# Patient Record
Sex: Female | Born: 1940 | Race: White | Hispanic: No | Marital: Married
Health system: Midwestern US, Academic
[De-identification: ages and names within clinical notes are randomized; demographics above are authoritative.]

---

## 2019-07-12 ENCOUNTER — Encounter: Admit: 2019-07-12 | Discharge: 2019-07-12

## 2019-07-13 ENCOUNTER — Encounter: Admit: 2019-07-13 | Discharge: 2019-07-13

## 2019-07-13 ENCOUNTER — Ambulatory Visit: Admit: 2019-07-13 | Discharge: 2019-07-14

## 2019-07-13 DIAGNOSIS — Z9189 Other specified personal risk factors, not elsewhere classified: Secondary | ICD-10-CM

## 2019-07-13 DIAGNOSIS — E7800 Pure hypercholesterolemia, unspecified: Secondary | ICD-10-CM

## 2019-07-13 DIAGNOSIS — K219 Gastro-esophageal reflux disease without esophagitis: Secondary | ICD-10-CM

## 2019-07-13 DIAGNOSIS — G4733 Obstructive sleep apnea (adult) (pediatric): Secondary | ICD-10-CM

## 2019-07-13 DIAGNOSIS — G459 Transient cerebral ischemic attack, unspecified: Secondary | ICD-10-CM

## 2019-07-13 DIAGNOSIS — E118 Type 2 diabetes mellitus with unspecified complications: Secondary | ICD-10-CM

## 2019-07-13 DIAGNOSIS — Z01810 Encounter for preprocedural cardiovascular examination: Secondary | ICD-10-CM

## 2019-07-13 DIAGNOSIS — I10 Essential (primary) hypertension: Secondary | ICD-10-CM

## 2019-07-13 DIAGNOSIS — M797 Fibromyalgia: Secondary | ICD-10-CM

## 2019-07-13 DIAGNOSIS — R7309 Other abnormal glucose: Secondary | ICD-10-CM

## 2019-07-13 MED ORDER — METOPROLOL TARTRATE 25 MG PO TAB
25 mg | ORAL_TABLET | Freq: Two times a day (BID) | ORAL | 3 refills | 90.00000 days | Status: DC
Start: 2019-07-13 — End: 2020-06-28

## 2019-07-13 MED ORDER — METOPROLOL TARTRATE 25 MG PO TAB
25 mg | ORAL_TABLET | Freq: Two times a day (BID) | ORAL | 3 refills | 90.00000 days | Status: DC
Start: 2019-07-13 — End: 2019-07-13

## 2019-07-13 MED ORDER — ROSUVASTATIN 20 MG PO TAB
20 mg | ORAL_TABLET | Freq: Every day | ORAL | 3 refills | 90.00000 days | Status: AC
Start: 2019-07-13 — End: ?

## 2019-07-13 MED ORDER — ROSUVASTATIN 20 MG PO TAB
20 mg | ORAL_TABLET | Freq: Every day | ORAL | 3 refills | 90.00000 days | Status: DC
Start: 2019-07-13 — End: 2019-07-13

## 2019-07-13 NOTE — Telephone Encounter
07/13/19 records received from Dr. Jackie Plum, Records are available in pt chart through [facility]

## 2019-07-14 LAB — LIPID PROFILE
Lab: 184
Lab: 305 — ABNORMAL HIGH (ref <150)
Lab: 40
Lab: 5
Lab: 61 — ABNORMAL HIGH (ref 5–40)
Lab: 83

## 2019-07-18 ENCOUNTER — Encounter: Admit: 2019-07-18 | Discharge: 2019-07-18

## 2019-07-18 ENCOUNTER — Ambulatory Visit: Admit: 2019-07-18 | Discharge: 2019-07-19

## 2019-07-18 ENCOUNTER — Ambulatory Visit: Admit: 2019-07-18 | Discharge: 2019-07-18

## 2019-07-18 DIAGNOSIS — Z01818 Encounter for other preprocedural examination: Secondary | ICD-10-CM

## 2019-07-18 DIAGNOSIS — M797 Fibromyalgia: Secondary | ICD-10-CM

## 2019-07-18 DIAGNOSIS — I10 Essential (primary) hypertension: Secondary | ICD-10-CM

## 2019-07-20 ENCOUNTER — Encounter: Admit: 2019-07-20 | Discharge: 2019-07-20

## 2019-07-20 NOTE — Telephone Encounter
Pt cleared for surgery.  Addendum added to OV and faxed to [facility] at Dr. Maple Hudson office. No further needs at this.

## 2019-07-20 NOTE — Telephone Encounter
Rita from Dr. Maple Hudson office called asking for cardiac clearance letter for knee surgery on 8/4.  Message sent to [facility] to review echo and stress test. Will plan to fax clearance letter when complete.     Velva Harman - Dr. Maple Hudson office  Fax [phone]  Phone [phone]

## 2019-07-25 ENCOUNTER — Encounter: Admit: 2019-07-25 | Discharge: 2019-07-25

## 2019-07-25 DIAGNOSIS — M797 Fibromyalgia: Secondary | ICD-10-CM

## 2019-07-25 DIAGNOSIS — I10 Essential (primary) hypertension: Secondary | ICD-10-CM

## 2019-07-25 DIAGNOSIS — Z01810 Encounter for preprocedural cardiovascular examination: Secondary | ICD-10-CM

## 2020-06-28 ENCOUNTER — Encounter: Admit: 2020-06-28 | Discharge: 2020-06-28 | Payer: MEDICARE

## 2020-06-28 MED ORDER — METOPROLOL TARTRATE 25 MG PO TAB
ORAL_TABLET | Freq: Two times a day (BID) | ORAL | 0 refills | 90.00000 days | Status: AC
Start: 2020-06-28 — End: ?

## 2022-05-20 ENCOUNTER — Encounter: Admit: 2022-05-20 | Discharge: 2022-05-20 | Payer: MEDICARE

## 2022-05-21 ENCOUNTER — Encounter: Admit: 2022-05-21 | Discharge: 2022-05-21 | Payer: MEDICARE

## 2022-05-21 ENCOUNTER — Ambulatory Visit: Admit: 2022-05-21 | Discharge: 2022-05-22 | Payer: MEDICARE

## 2022-05-21 DIAGNOSIS — E7800 Pure hypercholesterolemia, unspecified: Secondary | ICD-10-CM

## 2022-05-21 DIAGNOSIS — E039 Hypothyroidism, unspecified: Secondary | ICD-10-CM

## 2022-05-21 DIAGNOSIS — E119 Type 2 diabetes mellitus without complications: Secondary | ICD-10-CM

## 2022-05-21 DIAGNOSIS — Z136 Encounter for screening for cardiovascular disorders: Secondary | ICD-10-CM

## 2022-05-21 DIAGNOSIS — M545 Chronic low back pain, unspecified back pain laterality, unspecified whether sciatica present: Secondary | ICD-10-CM

## 2022-05-21 DIAGNOSIS — D6489 Other specified anemias: Secondary | ICD-10-CM

## 2022-05-21 DIAGNOSIS — Z9189 Other specified personal risk factors, not elsewhere classified: Secondary | ICD-10-CM

## 2022-05-21 DIAGNOSIS — M797 Fibromyalgia: Secondary | ICD-10-CM

## 2022-05-21 DIAGNOSIS — R55 Syncope and collapse: Secondary | ICD-10-CM

## 2022-05-21 DIAGNOSIS — I10 Essential (primary) hypertension: Secondary | ICD-10-CM

## 2022-05-21 DIAGNOSIS — G4733 Obstructive sleep apnea (adult) (pediatric): Secondary | ICD-10-CM

## 2022-05-21 DIAGNOSIS — G459 Transient cerebral ischemic attack, unspecified: Secondary | ICD-10-CM

## 2022-05-21 NOTE — Progress Notes
Date of Service: 05/21/2022    Norma Richardson is a 81 y.o. white  female.       HPI      Norma Richardson is a 81 y.o. 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    Patient has recorded orthostatic blood pressure at home.  She has first noticed dizziness and lightheadedness when changing position from sitting down to standing up, she recently recorded a blood pressure of 136/71 mmHg while sitting down, 79/45 mmHg when standing up.  She did not have any syncopal episodes but she did have falls due to overall gait instability.  Patient has also been experiencing dizziness and lightheadedness.    In 2020 she was evaluated with an echocardiogram and a stress test this was part of the preoperative evaluation preceding left knee surgery.  She was not found to have ischemia, normal LVEF and no evidence of structural heart disease.    In addition, today patient appeared to be very pale.  We did order laboratory work, it was remarkable for a hemoglobin of 10.7 g/dL, hematocrit 16.1%, WBC 7.69, platelets were 328,000, the chemistry profile was remarkable for an elevated creatinine at 1.55 mg/dL and an elevated calcium at 10.5 mg/dL (normal range between 0.9-60.4 mg/dL).    Review of previous laboratory work in July 2020 demonstrated normal hemoglobin and hematocrit, normal creatinine, at that time calcium was mildly elevated at 10.3 mg/dL.    Patient did not report chest pain or heart palpitations.         Vitals:    05/21/22 1440   BP: (!) 146/78   BP Source: Arm, Left Upper   Pulse: 92   SpO2: 98%   O2 Device: None (Room air)   PainSc: Zero   Weight: 69.7 kg (153 lb 9.6 oz)   Height: 165.1 cm (5' 5)     Body mass index is 25.56 kg/m?Marland Kitchen     Past Medical History  Patient Active Problem List    Diagnosis Date Noted   ? At risk for coronary artery disease 07/13/2019   ? Anxiety 07/12/2019   ? Fibromyalgia 07/12/2019   ? Gastroesophageal reflux disease 07/12/2019   ? Hyperlipidemia 07/12/2019   ? Hypertension 07/12/2019   ? Hypothyroidism 07/12/2019   ? Chronic tension headache 07/12/2019   ? Obstructive sleep apnea on CPAP 07/12/2019   ? Transient ischemic attack 07/12/2019   ? Type 2 diabetes mellitus without complication, without long-term current use of insulin (HCC) 07/12/2019   ? Preoperative cardiovascular examination 07/12/2019   ? Chronic low back pain 11/06/2010         Review of Systems   Constitutional: Negative.   HENT: Negative.    Eyes: Negative.    Cardiovascular: Positive for near-syncope.   Respiratory: Negative.    Endocrine: Negative.    Hematologic/Lymphatic: Negative.    Skin: Negative.    Musculoskeletal: Negative.    Gastrointestinal: Negative.    Genitourinary: Negative.    Neurological: Positive for dizziness, light-headedness and loss of balance.   Psychiatric/Behavioral: Negative.    Allergic/Immunologic: Negative.        Physical Exam  General Appearance: Pale  Skin: warm, moist, no ulcers or xanthomas  Eyes: conjunctivae and lids normal, pupils are equal and round  Lips & Oral Mucosa: no pallor or cyanosis  Neck Veins: neck veins  are flat, neck veins are not distended  Chest Inspection: chest is normal in appearance  Respiratory Effort: breathing comfortably, no respiratory distress  Auscultation/Percussion: lungs clear to auscultation, no rales or rhonchi, no wheezing  Cardiac Rhythm: regular rhythm and normal rate  Cardiac Auscultation: S1, S2 normal, no rub, no gallop  Murmurs: no murmur  Carotid Arteries: normal carotid upstroke bilaterally, no bruit  Lower Extremity Edema: no lower extremity edema  Abdominal Exam: soft, non-tender, no masses, bowel sounds normal  Liver & Spleen: no organomegaly  Language and Memory: patient responsive and seems to comprehend information  Neurologic Exam: neurological assessment grossly intact      Cardiovascular Studies  Twelve-lead EKG demonstrates normal sinus rhythm, leftward axis, no ST segment T wave changes    Cardiovascular Health Factors  Vitals BP Readings from Last 3 Encounters:   05/21/22 (!) 146/78   07/18/19 (!) 147/62   07/13/19 (!) 142/74     Wt Readings from Last 3 Encounters:   05/21/22 69.7 kg (153 lb 9.6 oz)   07/18/19 81.2 kg (179 lb)   07/13/19 81.2 kg (179 lb)     BMI Readings from Last 3 Encounters:   05/21/22 25.56 kg/m?   07/18/19 29.79 kg/m?   07/13/19 29.79 kg/m?      Smoking Social History     Tobacco Use   Smoking Status Never   Smokeless Tobacco Never      Lipid Profile Cholesterol   Date Value Ref Range Status   12/18/2021 149  Final     HDL   Date Value Ref Range Status   12/18/2021 31 (L) >=40 Final     LDL   Date Value Ref Range Status   12/18/2021 42  Final     Triglycerides   Date Value Ref Range Status   12/18/2021 383 (H) <150 Final      Blood Sugar Hemoglobin A1C   Date Value Ref Range Status   04/08/2022 6.6 (H) 4.5 - 6.5 Final     Glucose   Date Value Ref Range Status   05/12/2022 153 (H) 70 - 105 Final   07/11/2019 114 (H) 70 - 105 Final   10/25/2018 129 (H) 83 - 110 Final   12/02/2005 95 70 - 110 MG/DL Final     Glucose, POC   Date Value Ref Range Status   12/04/2005 166 (H) 70 - 110 MG/DL Final   16/09/9603 540 (H) 70 - 110 MG/DL Final   98/10/9146 829 (H) 70 - 110 MG/DL Final          Problems Addressed Today  Encounter Diagnoses   Name Primary?   ? Pure hypercholesterolemia Yes   ? Primary hypertension    ? Transient ischemic attack    ? Chronic low back pain, unspecified back pain laterality, unspecified whether sciatica present    ? Fibromyalgia    ? Acquired hypothyroidism    ? Obstructive sleep apnea on CPAP    ? Type 2 diabetes mellitus without complication, without long-term current use of insulin (HCC)    ? At risk for coronary artery disease    ? Screening for heart disease    ? Vasovagal reaction    ? Anemia due to other cause, not classified        Assessment and Plan     Assessment:    1.  Orthostatic hypotension- patient did not become hemodynamically unstable but she did record a blood pressure of 79/45 mmHg we stand up  posture  2.  Very likely vasovagal reaction perhaps due mainly to peripheral neuropathy in the setting of diabetes mellitus type 2 requiring insulin  3.  Hypertension-patient was on beta-blocker, metoprolol she discontinued  4.  Diabetes mellitus type 2 requiring insulin  ? Patient is also on metformin  ? She is on gabapentin and also amitriptyline-this could have some contribution to decreasing blood pressure  5.  Osteoarthritis-status post previous left knee surgery and currently with left shoulder pain  6.  Abnormal laboratory work- decreased hemoglobin hematocrit, elevated calcium (mild elevation)  7.  Renal insufficiency-elevated creatinine currently 1.44 mg/dL compared to 1.61 mg/dL in July 2020  8.  Shortness of breath with walking activity- this could be secondary to physical deconditioning as well as variations in blood pressure  9.  Overall ill appearance, patient was very pale today in our office  10.  OSA using a CPAP machine    Plan:    1.  I recommended the following:  ? Slight increase in the salt intake  ? Maintain an euvolemic status and drink water  ? Apply compression stockings during wake hours  ? Continue to monitor blood pressure at home  2.  ASAP evaluation in your office regarding the abnormalities of hemoglobin and hematocrit, consider GI evaluation with perhaps EGD and colonoscopy  3.  Revised patient's diabetes regimen and assess whether she needs to continue metformin in addition to insulin      Total Time Today was 40 minutes in the following activities: Preparing to see the patient, Obtaining and/or reviewing separately obtained history, Performing a medically appropriate examination and/or evaluation, Counseling and educating the patient/family/caregiver, Ordering medications, tests, or procedures, Referring and communication with other health care professionals (when not separately reported), Documenting clinical information in the electronic or other health record, Independently interpreting results (not separately reported) and communicating results to the patient/family/caregiver and Care coordination (not separately reported)         Current Medications (including today's revisions)  ? amitriptyline (ELAVIL) 50 mg tablet Take two tablets by mouth at bedtime daily.   ? cholecalciferol (VITAMIN D-3) 125 mcg (5,000 unit) tablet Take one tablet by mouth daily.   ? citalopram (CELEXA) 40 mg tablet    ? cyanocobalamin (vit B-12) (RUBRAMIN PC) 1,000 mcg/mL injection solution INJECT 1 ML (CC) INTRAMUSCULARLY ONCE A WEEK FOR ONE MONTH THEN ONCE MONTHLY THERE AFTER   ? doxylamine succinate (SLEEP AID (DOXYLAMINE) PO) Take  by mouth.   ? gabapentin (NEURONTIN) 300 mg capsule Take one capsule by mouth every 8 hours.   ? insulin glargine (LANTUS SOLOSTAR) 100 unit/mL (3 mL) injection PEN Inject fifty Units under the skin at bedtime daily.   ? levothyroxine (SYNTHROID) 88 mcg tablet Take one tablet by mouth daily 30 minutes before breakfast.   ? metFORMIN (GLUCOPHAGE) 1,000 mg tablet Take one tablet by mouth twice daily with meals.   ? metFORMIN (GLUCOPHAGE) 500 mg tablet Take one tablet by mouth daily. NOON   ? metoprolol tartrate 25 mg tablet TAKE 1 TABLET TWICE A DAY   ? nystatin (MYCOSTATIN) 100,000 unit/g topical cream Apply  topically to affected area twice daily.   ? omeprazole DR (PRILOSEC) 40 mg capsule    ? rosuvastatin (CRESTOR) 20 mg tablet Take one tablet by mouth daily.   ? vitamins, multiple cap Take one capsule by mouth daily.   ? zolpidem (AMBIEN) 10 mg tablet Take one tablet by mouth at bedtime as needed for Sleep.

## 2022-05-22 ENCOUNTER — Encounter: Admit: 2022-05-22 | Discharge: 2022-05-22 | Payer: MEDICARE

## 2022-05-22 DIAGNOSIS — Z136 Encounter for screening for cardiovascular disorders: Secondary | ICD-10-CM

## 2022-05-22 DIAGNOSIS — G4733 Obstructive sleep apnea (adult) (pediatric): Secondary | ICD-10-CM

## 2022-05-22 DIAGNOSIS — Z9189 Other specified personal risk factors, not elsewhere classified: Secondary | ICD-10-CM

## 2022-05-22 DIAGNOSIS — E119 Type 2 diabetes mellitus without complications: Secondary | ICD-10-CM

## 2022-05-22 DIAGNOSIS — G459 Transient cerebral ischemic attack, unspecified: Secondary | ICD-10-CM

## 2022-05-22 DIAGNOSIS — I10 Essential (primary) hypertension: Secondary | ICD-10-CM

## 2022-05-22 DIAGNOSIS — E7800 Pure hypercholesterolemia, unspecified: Secondary | ICD-10-CM

## 2022-05-22 DIAGNOSIS — M545 Chronic low back pain, unspecified back pain laterality, unspecified whether sciatica present: Secondary | ICD-10-CM

## 2022-05-22 DIAGNOSIS — E039 Hypothyroidism, unspecified: Secondary | ICD-10-CM

## 2022-05-22 DIAGNOSIS — M797 Fibromyalgia: Secondary | ICD-10-CM

## 2022-05-26 IMAGING — CR [ID]
1 series · 1 of 1 positions shown · non-contrast
Comparison: none

[x chest ap]
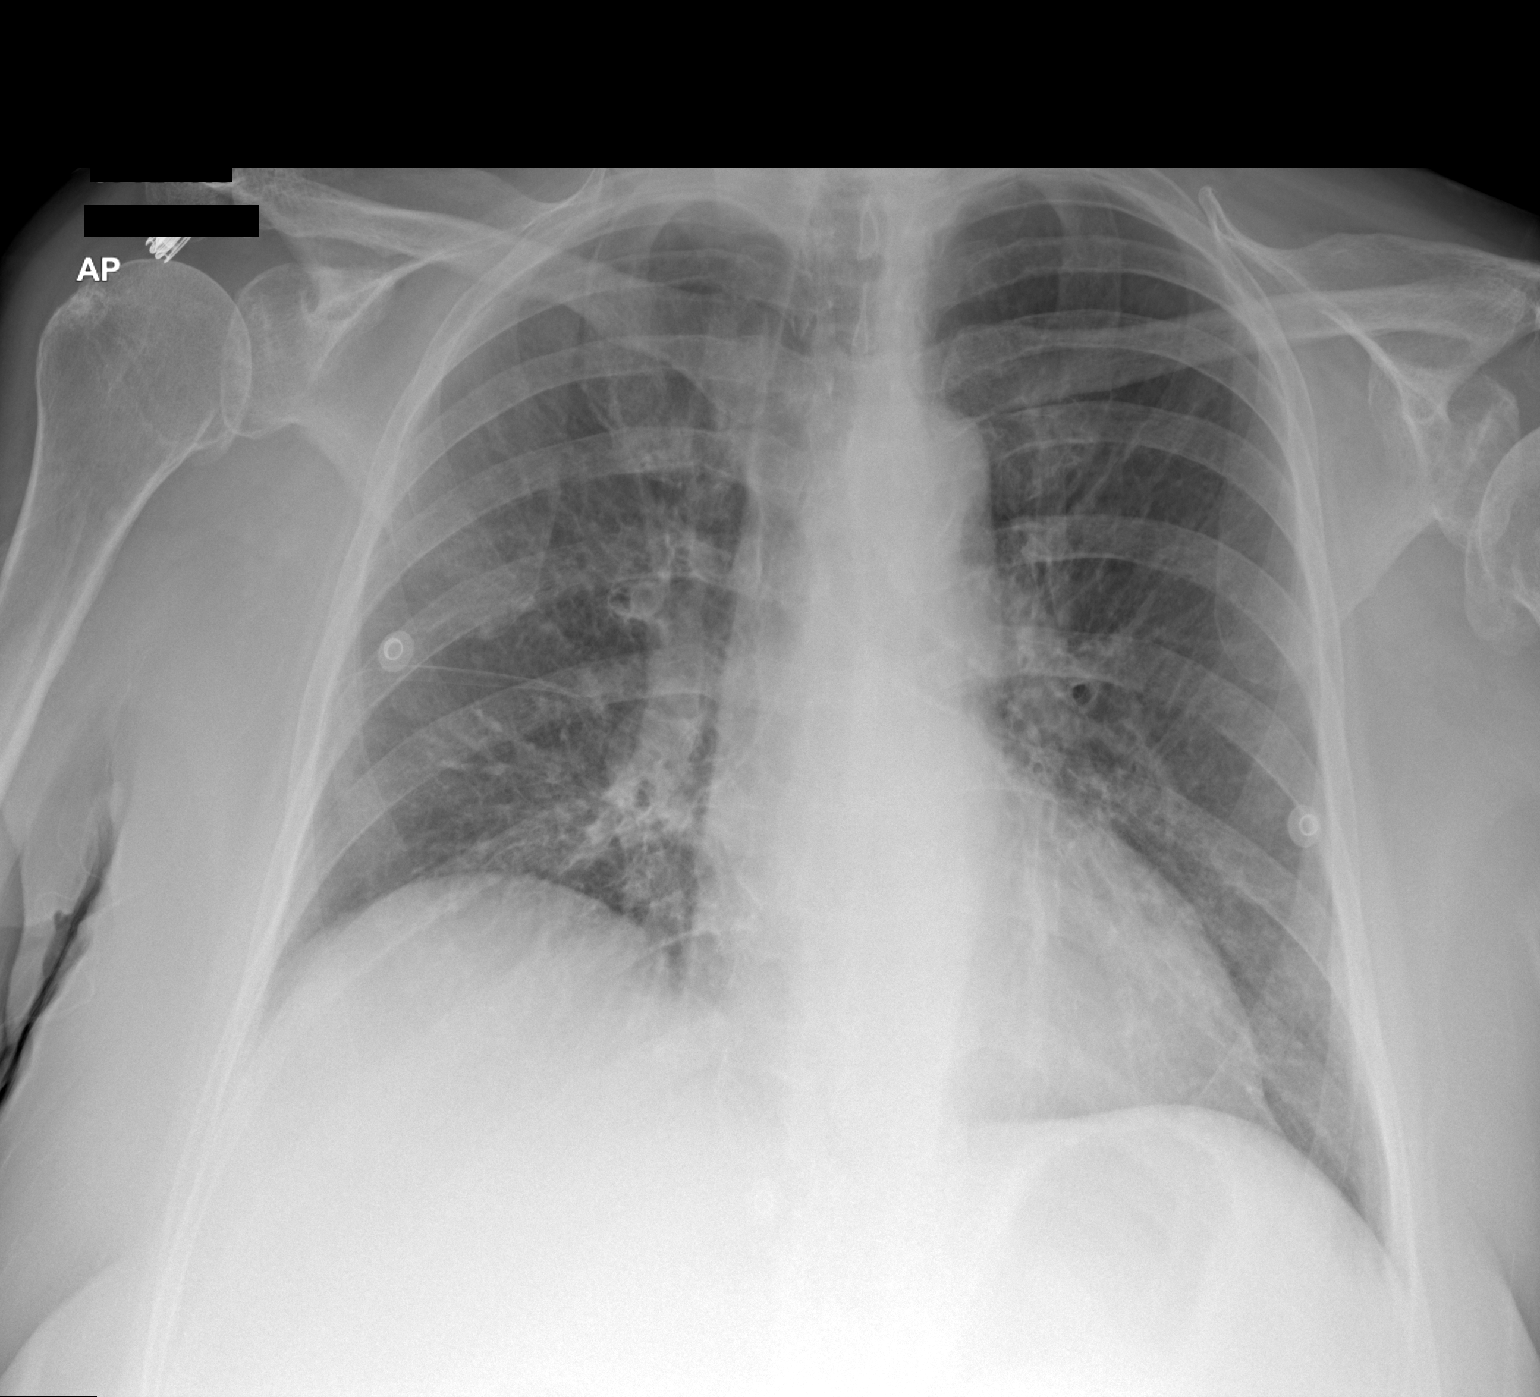

[1 of 1 positions shown; findings below may reference images not displayed]

DIAGNOSTIC STUDIES

EXAM

XR chest 1V

INDICATION

chest pain
Pt c/o chest pain. JT/TJ

TECHNIQUE

Portable AP chest

COMPARISONS

December 22, 2021

FINDINGS

Cardiomediastinal silhouette is stable and unchanged. No acute infiltrates are seen. Osseous
structures are unchanged.

IMPRESSION

No acute infiltrates throughout the chest.

Tech Notes:

Pt c/o chest pain. JT/TJ

## 2022-06-09 ENCOUNTER — Encounter: Admit: 2022-06-09 | Discharge: 2022-06-09 | Payer: MEDICARE

## 2023-07-09 ENCOUNTER — Encounter: Admit: 2023-07-09 | Discharge: 2023-07-09 | Payer: MEDICARE

## 2023-07-09 ENCOUNTER — Ambulatory Visit: Admit: 2023-07-09 | Discharge: 2023-07-09 | Payer: MEDICARE

## 2023-07-09 DIAGNOSIS — R42 Dizziness and giddiness: Secondary | ICD-10-CM

## 2024-12-04 ENCOUNTER — Encounter: Admit: 2024-12-04 | Discharge: 2024-12-04 | Payer: MEDICARE

## 2024-12-04 ENCOUNTER — Inpatient Hospital Stay: Admit: 2024-12-04 | Discharge: 2024-12-04 | Payer: MEDICARE

## 2024-12-05 ENCOUNTER — Encounter: Admit: 2024-12-05 | Discharge: 2024-12-04 | Payer: MEDICARE

## 2024-12-05 ENCOUNTER — Encounter: Admit: 2024-12-05 | Discharge: 2024-12-05 | Payer: MEDICARE

## 2024-12-05 ENCOUNTER — Inpatient Hospital Stay: Admit: 2024-12-05 | Discharge: 2024-12-05 | Payer: MEDICARE

## 2024-12-05 ENCOUNTER — Inpatient Hospital Stay: Admit: 2024-12-05 | Payer: MEDICARE

## 2024-12-06 ENCOUNTER — Inpatient Hospital Stay: Admit: 2024-12-06 | Discharge: 2024-12-06 | Payer: MEDICARE

## 2024-12-07 ENCOUNTER — Inpatient Hospital Stay: Admit: 2024-12-07 | Discharge: 2024-12-07 | Payer: MEDICARE

## 2024-12-07 ENCOUNTER — Encounter: Admit: 2024-12-07 | Discharge: 2024-12-07 | Payer: MEDICARE

## 2025-01-03 ENCOUNTER — Encounter: Admit: 2025-01-03 | Discharge: 2025-01-03 | Payer: MEDICARE

## 2025-01-03 NOTE — Progress Notes [1]
 CXR dated 12/07/2024 sent to PCP on file. Report recommends a follow up CXR in 4-6 weeks.

## 2025-01-18 ENCOUNTER — Encounter: Admit: 2025-01-18 | Discharge: 2025-01-18 | Payer: MEDICARE
